# Patient Record
Sex: Female | Born: 1985
Health system: Southern US, Community
[De-identification: ages and names within clinical notes are randomized; demographics above are authoritative.]

## PROBLEM LIST (undated history)

## (undated) DIAGNOSIS — R002 Palpitations: Secondary | ICD-10-CM

## (undated) DIAGNOSIS — Z8249 Family history of ischemic heart disease and other diseases of the circulatory system: Secondary | ICD-10-CM

## (undated) HISTORY — DX: Family history of ischemic heart disease and other diseases of the circulatory system: Z82.49

## (undated) HISTORY — DX: Palpitations: R00.2

## (undated) HISTORY — PX: WISDOM TOOTH EXTRACTION: SHX21

---

## 2017-10-08 ENCOUNTER — Encounter: Payer: Self-pay | Admitting: Physician Assistant

## 2017-10-08 ENCOUNTER — Ambulatory Visit: Payer: Self-pay | Admitting: Physician Assistant

## 2017-12-23 ENCOUNTER — Ambulatory Visit (INDEPENDENT_AMBULATORY_CARE_PROVIDER_SITE_OTHER): Payer: BLUE CROSS/BLUE SHIELD | Admitting: Cardiology

## 2017-12-23 ENCOUNTER — Encounter: Payer: Self-pay | Admitting: Cardiology

## 2017-12-23 ENCOUNTER — Encounter (INDEPENDENT_AMBULATORY_CARE_PROVIDER_SITE_OTHER): Payer: Self-pay

## 2017-12-23 VITALS — BP 112/70 | HR 55 | Ht 61.0 in | Wt 119.4 lb

## 2017-12-23 DIAGNOSIS — Z8249 Family history of ischemic heart disease and other diseases of the circulatory system: Secondary | ICD-10-CM

## 2017-12-23 DIAGNOSIS — R002 Palpitations: Secondary | ICD-10-CM

## 2017-12-23 HISTORY — DX: Family history of ischemic heart disease and other diseases of the circulatory system: Z82.49

## 2017-12-23 NOTE — Patient Instructions (Addendum)
Medication Instructions:  Your physician recommends that you continue on your current medications as directed. Please refer to the Current Medication list given to you today.  If you need a refill on your cardiac medications, please contact your pharmacy first.  Labwork: None ordered   Testing/Procedures: Your physician has requested that you have an exercise tolerance test. For further information please visit [email or web address] Please also follow instruction sheet, as given.  Non-Cardiac CT scanning, (CAT scanning), is a noninvasive, special x-ray that produces cross-sectional images of the body using x-rays and a computer. CT scans help physicians diagnose and treat medical conditions. For some CT exams, a contrast material is used to enhance visibility in the area of the body being studied. CT scans provide greater clarity and reveal more details than regular x-ray exams.  Follow-Up: Your physician wants you to follow-up in: 5 years with Dr. Mayford Knife. You will receive a reminder letter in the mail two months in advance. If you don't receive a letter, please call our office to schedule the follow-up appointment.  Any Other Special Instructions Will Be Listed Below (If Applicable).   Thank you for choosing [facility]    Lyda Perone, RN  [phone]  If you need a refill on your cardiac medications before your next appointment, please call your pharmacy.

## 2017-12-23 NOTE — Progress Notes (Signed)
Cardiology Office Note    Date:  12/23/2017   ID:  Brenae, Lasecki September 25, 1985, MRN [id number]  PCP:  Shirlean Mylar, MD  Cardiologist:  Armanda Magic, MD   Chief Complaint  Patient presents with  . Irregular Heart Beat    History of Present Illness:  Sacheen Arrasmith is a 32 y.o. female who is being seen today for the evaluation of palpitations at the request of Shirlean Mylar, MD.  This is a very pleasant 32 year old female with no prior past medical history who recently saw Dr. Hyman Hopes for annual physical and was complaining about palpitations.  She also has a history of early CAD.  Her maternal uncle died of a heart attack at 89 and her mother had a heart attack at 79 as well.  Her maternal grandmother also had a heart attack at 70.  She says she is occasionally has palpitations but she thinks they are just a skipped beat or an extra beat and they are very infrequent.  She may have 1 or 2 episodes in a month and did not have any for a couple of months.  She denies any chest pain or pressure, SOB, DOE, PND, orthopnea, LE edema, dizziness or syncope. She is compliant with her meds and is tolerating meds with no SE. her last lipid showed total cholesterol 174, LDL 104, HDL 54 triglycerides 84.  Past Medical History:  Diagnosis Date  . Family history of premature CAD 12/23/2017  . Palpitations     Current Medications: Current Meds  Medication Sig  . GINKGO BILOBA PO Take 1 capsule by mouth daily.  . Multiple Vitamin (MULTIVITAMIN) tablet Take 1 tablet by mouth daily.  . Norethin Ace-Eth Estrad-FE (MINASTRIN 24 FE) 1-20 MG-MCG(24) CHEW Chew 1 tablet by mouth daily.  . Wheat Dextrin (BENEFIBER) POWD Take by mouth as directed.    Allergies:   Penicillins   Social History   Socioeconomic History  . Marital status: Married    Spouse name: Not on file  . Number of children: Not on file  . Years of education: COLLEGE  . Highest education level: Not on file  Occupational History  .  Occupation: TEACHER AT [employer]  Social Needs  . Financial resource strain: Not on file  . Food insecurity:    Worry: Not on file    Inability: Not on file  . Transportation needs:    Medical: Not on file    Non-medical: Not on file  Tobacco Use  . Smoking status: Never Smoker  . Smokeless tobacco: Never Used  Substance and Sexual Activity  . Alcohol use: Yes  . Drug use: No  . Sexual activity: Not on file  Lifestyle  . Physical activity:    Days per week: Not on file    Minutes per session: Not on file  . Stress: Not on file  Relationships  . Social connections:    Talks on phone: Not on file    Gets together: Not on file    Attends religious service: Not on file    Active member of club or organization: Not on file    Attends meetings of clubs or organizations: Not on file    Relationship status: Not on file  Other Topics Concern  . Not on file  Social History Narrative  . Not on file     Family History:  The patient's family history includes Heart attack (age of onset: 24) in her maternal grandmother, maternal uncle, and mother;  Hypertension (age of onset: 39) in her father.   ROS:   Please see the history of present illness.    ROS All other systems reviewed and are negative.  No flowsheet data found.  PHYSICAL EXAM:   VS:  BP 112/70   Pulse (!) 55   Ht  (1.549 m)   Wt 119 lb 6.4 oz (54.2 kg)   BMI 22.56 kg/m    GEN: Well nourished, well developed, in no acute distress  HEENT: normal  Neck: no JVD, carotid bruits, or masses Cardiac: RRR; no murmurs, rubs, or gallops,no edema.  Intact distal pulses bilaterally.  Respiratory:  clear to auscultation bilaterally, normal work of breathing GI: soft, nontender, nondistended, + BS MS: no deformity or atrophy  Skin: warm and dry, no rash Neuro:  Alert and Oriented x 3, Strength and sensation are intact Psych: euthymic mood, full affect  Wt Readings from Last 3 Encounters:  12/23/17 119 lb 6.4 oz (54.2 kg)        Studies/Labs Reviewed:   EKG:  EKG is ordered today.  The ekg ordered today demonstrates bradycardia 55 bpm with no ST changes.  Recent Labs: No results found for requested labs within last 8760 hours.   Lipid Panel No results found for: CHOL, TRIG, HDL, CHOLHDL, VLDL, LDLCALC, LDLDIRECT  Additional studies/ records that were reviewed today include:  Office notes from PCP    ASSESSMENT:    1. Heart palpitations   2. Family history of premature CAD      PLAN:  In order of problems listed above:  1.  Palpitations -EKG appears normal without any arrhythmias.  These are very infrequent and may occur 1 or 2 times in a month and she may not have any for another few months.  I recommended that she consider getting the alive core device so that she can catch these when she has them.  2.  Family history of premature CAD with her mother, maternal uncle, maternal grandmother all having MIs in their 29s.  Baseline EKG is normal and lipid panel was normal.  I will get a baseline exercise treadmill test and CT calcium score to assess further risk.  If this is normal I told her she will need to follow-up in 5 years.    Medication Adjustments/Labs and Tests Ordered: Current medicines are reviewed at length with the patient today.  Concerns regarding medicines are outlined above.  Medication changes, Labs and Tests ordered today are listed in the Patient Instructions below.  There are no Patient Instructions on file for this visit.   Signed, Armanda Magic, MD  12/23/2017 8:59 AM    [facility] [address] [place], [place], [place]  [address] Phone: [phone]; Fax: [phone]

## 2018-01-13 ENCOUNTER — Ambulatory Visit (INDEPENDENT_AMBULATORY_CARE_PROVIDER_SITE_OTHER): Payer: BLUE CROSS/BLUE SHIELD

## 2018-01-13 ENCOUNTER — Ambulatory Visit (INDEPENDENT_AMBULATORY_CARE_PROVIDER_SITE_OTHER)
Admission: RE | Admit: 2018-01-13 | Discharge: 2018-01-13 | Disposition: A | Discharge status: Home / Self Care | Payer: Self-pay | Source: Ambulatory Visit | Attending: Cardiology | Admitting: Cardiology

## 2018-01-13 DIAGNOSIS — Z8249 Family history of ischemic heart disease and other diseases of the circulatory system: Secondary | ICD-10-CM | POA: Diagnosis not present

## 2018-01-13 DIAGNOSIS — R002 Palpitations: Secondary | ICD-10-CM

## 2018-01-13 LAB — EXERCISE TOLERANCE TEST
Estimated workload: 13.4 METS
Exercise duration (min): 11 min
Exercise duration (sec): 30 s
MPHR: 188 {beats}/min
Peak HR: 181 {beats}/min
Percent HR: 96 %
RPE: 17
Rest HR: 81 {beats}/min

## 2019-07-06 ENCOUNTER — Other Ambulatory Visit: Payer: Self-pay

## 2019-07-06 DIAGNOSIS — Z20822 Contact with and (suspected) exposure to covid-19: Secondary | ICD-10-CM

## 2019-07-08 LAB — NOVEL CORONAVIRUS, NAA: SARS-CoV-2, NAA: NOT DETECTED

## 2019-12-29 IMAGING — CT CT HEART SCORING
2 series · 16 of 20 positions shown, 18 images · non-contrast
Comparison: None.

CLINICAL DATA: Risk stratification

EXAM:
Coronary Calcium Score
TECHNIQUE: The patient was scanned on a Siemens Force scanner. Axial
non-contrast 3 mm slices were carried out through the heart. The
data set was analyzed on a dedicated work station and scored using
the Agatson method.

[Series 2: casc 3.0 i36f 2 bestdiast 72 % · axial · 0.30mm/px · z∈[-200,-86]mm · 8 of 50 slices shown, 10 images]
[im 6/50  vessel]
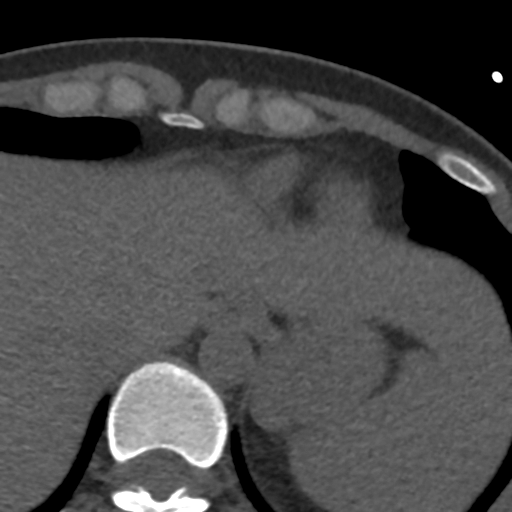
[im 6/50  lung]
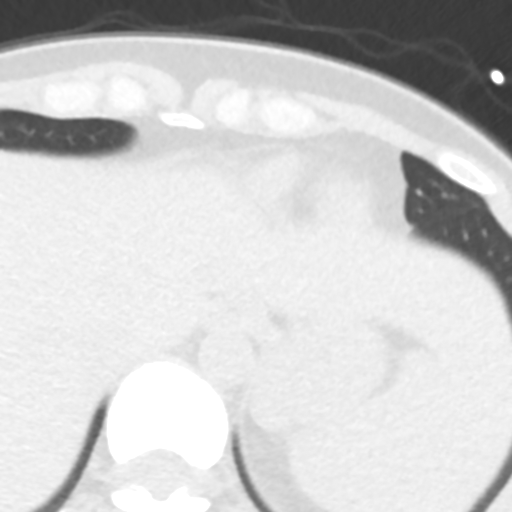
[im 11/50  vessel]
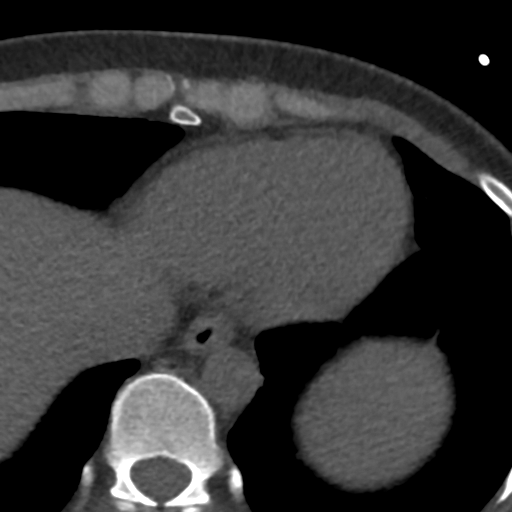
[im 17/50  vessel]
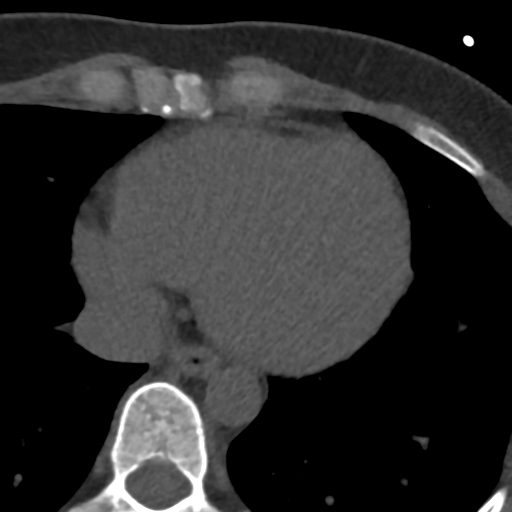
[im 22/50  vessel]
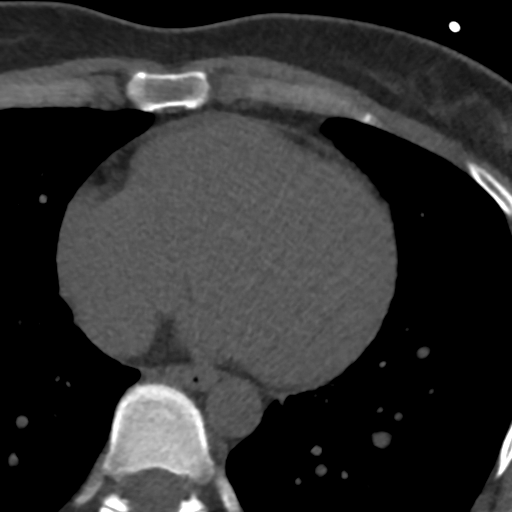
[im 28/50  vessel]
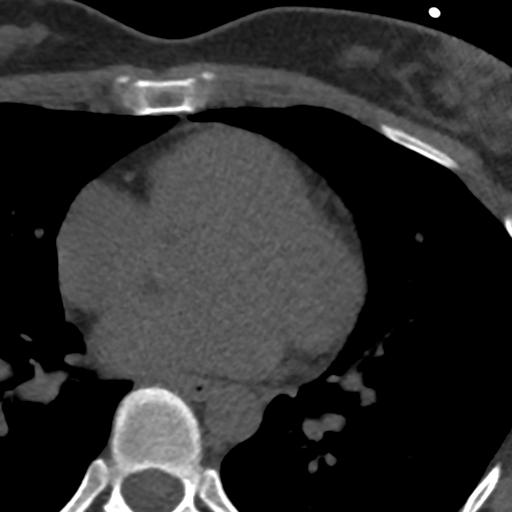
[im 28/50  lung]
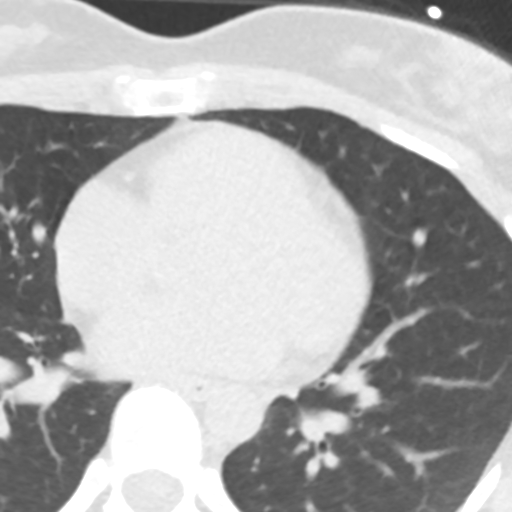
[im 33/50  vessel]
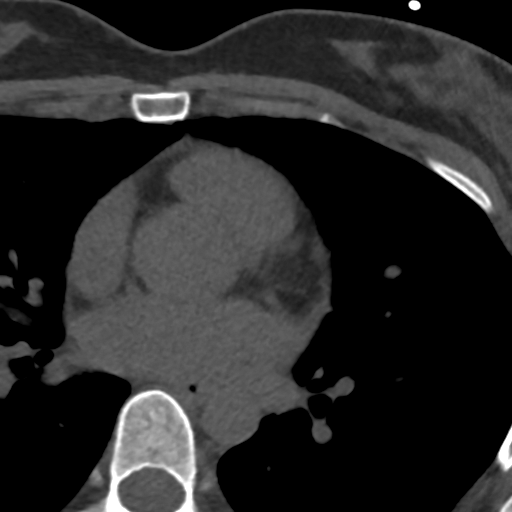
[im 39/50  vessel]
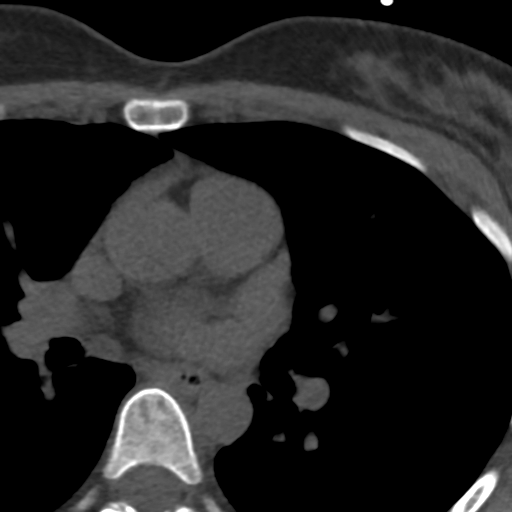
[im 44/50  vessel]
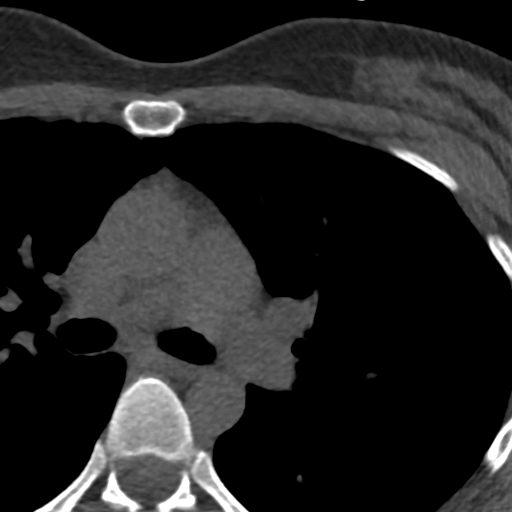

[Series 4: lung st 72 % · axial · 0.60mm/px · z∈[-200,-86]mm · 8 of 50 slices shown]
[im 6/50  lung]
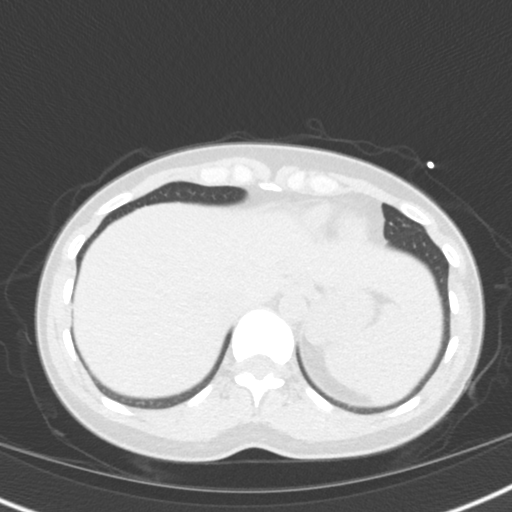
[im 11/50  lung]
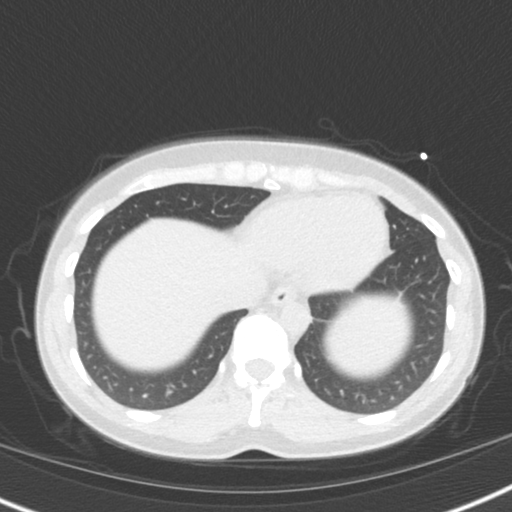
[im 17/50  lung]
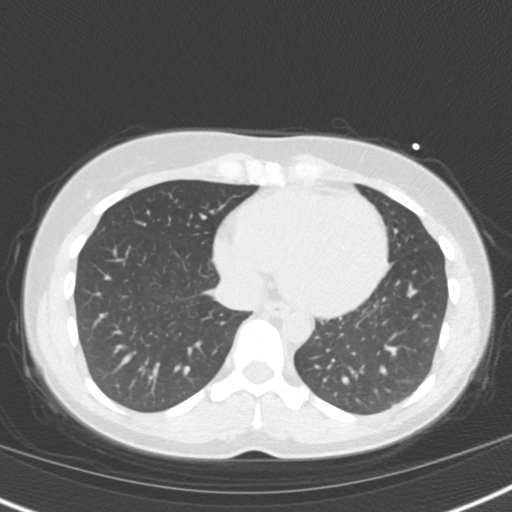
[im 22/50  lung]
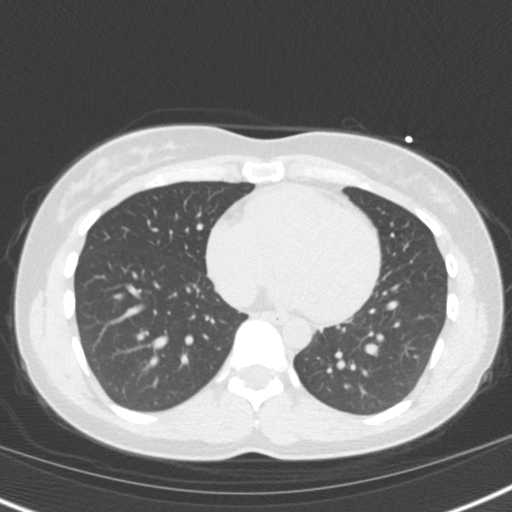
[im 28/50  lung]
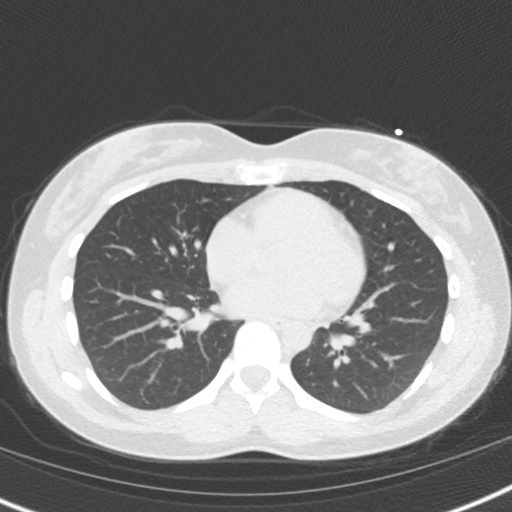
[im 33/50  lung]
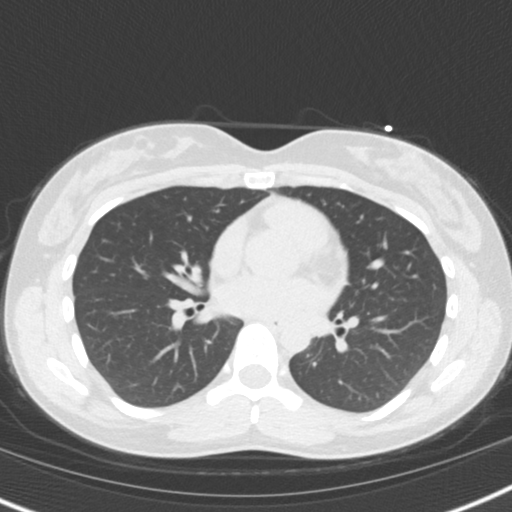
[im 39/50  lung]
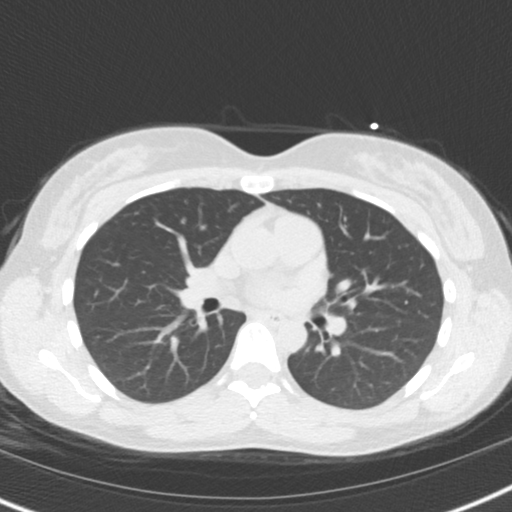
[im 44/50  lung]
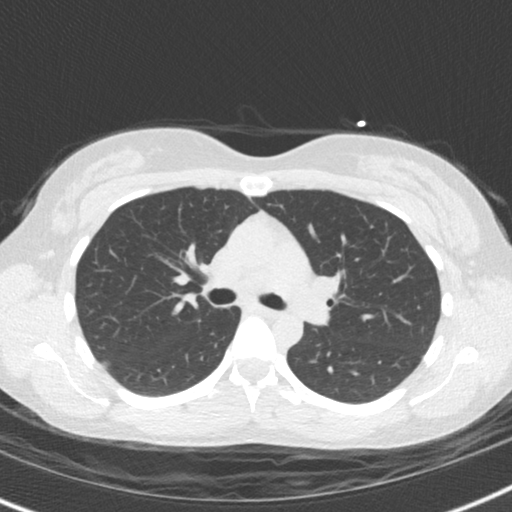

[16 of 20 positions shown; findings below may reference images not displayed]

FINDINGS: Non-cardiac: See separate report from [REDACTED].

Ascending Aorta: Normal size, no calcifications.

Pericardium: Normal.

Coronary arteries: Normal origin.
IMPRESSION: Coronary calcium score of 0. This was 0 percentile for age and sex
matched control.

EXAM:
OVER-READ INTERPRETATION  CT CHEST

The following report is an over-read performed by radiologist Dr.
Butemberg Eustache [REDACTED] on 01/13/2018. This over-read
does not include interpretation of cardiac or coronary anatomy or
pathology. The coronary calcium score interpretation by the
cardiologist is attached.
FINDINGS: Vascular: Heart is normal size.  Visualized aorta is normal caliber.

Mediastinum/Nodes: No adenopathy in the lower mediastinum or hila.

Lungs/Pleura: Visualized lungs clear.

Upper Abdomen: Imaging into the upper abdomen shows no acute
findings.

Musculoskeletal: Chest wall soft tissues are unremarkable. No acute
bony abnormality.
IMPRESSION: No acute or significant extracardiac abnormality.
